# Patient Record
Sex: Male | Born: 1935 | Race: Black or African American | Hispanic: No
Health system: Southern US, Community
[De-identification: ages and names within clinical notes are randomized; demographics above are authoritative.]

---

## 2006-02-22 ENCOUNTER — Ambulatory Visit: Payer: Self-pay | Admitting: Ophthalmology

## 2006-02-22 ENCOUNTER — Other Ambulatory Visit: Payer: Self-pay

## 2006-02-28 ENCOUNTER — Ambulatory Visit: Payer: Self-pay | Admitting: Ophthalmology

## 2008-03-29 ENCOUNTER — Other Ambulatory Visit: Payer: Self-pay

## 2008-03-29 ENCOUNTER — Inpatient Hospital Stay: Payer: Self-pay | Admitting: Internal Medicine

## 2012-09-10 ENCOUNTER — Emergency Department: Payer: Self-pay | Admitting: Emergency Medicine

## 2013-03-02 ENCOUNTER — Ambulatory Visit: Payer: Self-pay | Admitting: Internal Medicine

## 2013-03-20 LAB — BASIC METABOLIC PANEL
Anion Gap: 5 — ABNORMAL LOW (ref 7–16)
BUN: 20 mg/dL — ABNORMAL HIGH (ref 7–18)
Co2: 34 mmol/L — ABNORMAL HIGH (ref 21–32)
Creatinine: 0.97 mg/dL (ref 0.60–1.30)
EGFR (African American): >60
Glucose: 149 mg/dL — ABNORMAL HIGH (ref 65–99)
Osmolality: 287 (ref 275–301)
Potassium: 4.2 mmol/L (ref 3.5–5.1)
Sodium: 141 mmol/L (ref 136–145)

## 2013-03-20 LAB — CBC
HGB: 12.9 g/dL — ABNORMAL LOW (ref 13.0–18.0)
MCH: 27.4 pg (ref 26.0–34.0)
MCHC: 32.6 g/dL (ref 32.0–36.0)
MCV: 84 fL (ref 80–100)
Platelet: 179 10*3/uL (ref 150–440)
RBC: 4.73 10*6/uL (ref 4.40–5.90)
RDW: 19.9 % — ABNORMAL HIGH (ref 11.5–14.5)
WBC: 3.7 10*3/uL — ABNORMAL LOW (ref 3.8–10.6)

## 2013-03-20 LAB — URINALYSIS, COMPLETE
Hyaline Cast: 2
Nitrite: POSITIVE
Ph: 5 (ref 4.5–8.0)
RBC,UR: 142 /HPF (ref 0–5)
Specific Gravity: 1.028 (ref 1.003–1.030)

## 2013-03-21 ENCOUNTER — Inpatient Hospital Stay: Payer: Self-pay | Admitting: Family Medicine

## 2013-03-22 ENCOUNTER — Ambulatory Visit: Payer: Self-pay | Admitting: Radiation Oncology

## 2013-03-22 LAB — COMPREHENSIVE METABOLIC PANEL
Albumin: 2.3 g/dL — ABNORMAL LOW (ref 3.4–5.0)
Anion Gap: 5 — ABNORMAL LOW (ref 7–16)
BUN: 20 mg/dL — ABNORMAL HIGH (ref 7–18)
Bilirubin,Total: 0.6 mg/dL (ref 0.2–1.0)
Chloride: 101 mmol/L (ref 98–107)
Co2: 31 mmol/L (ref 21–32)
Creatinine: 0.93 mg/dL (ref 0.60–1.30)
Glucose: 152 mg/dL — ABNORMAL HIGH (ref 65–99)
Osmolality: 279 (ref 275–301)
Potassium: 4.2 mmol/L (ref 3.5–5.1)
SGPT (ALT): 9 U/L — ABNORMAL LOW (ref 12–78)
Total Protein: 6 g/dL — ABNORMAL LOW (ref 6.4–8.2)

## 2013-03-22 LAB — MAGNESIUM: Magnesium: 1.4 mg/dL — ABNORMAL LOW

## 2013-03-22 LAB — CBC WITH DIFFERENTIAL/PLATELET
Basophil #: 0 10*3/uL (ref 0.0–0.1)
Eosinophil #: 0 10*3/uL (ref 0.0–0.7)
HCT: 34.2 % — ABNORMAL LOW (ref 40.0–52.0)
HGB: 11.3 g/dL — ABNORMAL LOW (ref 13.0–18.0)
Lymphocyte %: 11.1 %
MCH: 27.2 pg (ref 26.0–34.0)
MCHC: 33 g/dL (ref 32.0–36.0)
Monocyte %: 8.9 %
RBC: 4.16 10*6/uL — ABNORMAL LOW (ref 4.40–5.90)
RDW: 19.4 % — ABNORMAL HIGH (ref 11.5–14.5)
WBC: 3.2 10*3/uL — ABNORMAL LOW (ref 3.8–10.6)

## 2013-03-22 LAB — TSH: Thyroid Stimulating Horm: 0.669 u[IU]/mL

## 2013-03-22 LAB — PSA: PSA: 8586 ng/mL — ABNORMAL HIGH (ref 0.0–4.0)

## 2013-03-23 LAB — HEMOGLOBIN: HGB: 12.5 g/dL — ABNORMAL LOW (ref 13.0–18.0)

## 2013-03-26 LAB — CREATININE, SERUM
Creatinine: 0.91 mg/dL (ref 0.60–1.30)
EGFR (African American): >60

## 2013-03-30 LAB — HEPATIC FUNCTION PANEL A (ARMC)
Albumin: 2.7 g/dL — ABNORMAL LOW (ref 3.4–5.0)
Bilirubin,Total: 0.3 mg/dL (ref 0.2–1.0)
SGOT(AST): 30 U/L (ref 15–37)
SGPT (ALT): 25 U/L (ref 12–78)
Total Protein: 6.4 g/dL (ref 6.4–8.2)

## 2013-03-30 LAB — CBC CANCER CENTER
Basophil #: 0.1 x10 3/mm (ref 0.0–0.1)
Basophil %: 0.9 %
Eosinophil #: 0 x10 3/mm (ref 0.0–0.7)
HCT: 35.2 % — ABNORMAL LOW (ref 40.0–52.0)
HGB: 11.5 g/dL — ABNORMAL LOW (ref 13.0–18.0)
Lymphocyte #: 0.3 x10 3/mm — ABNORMAL LOW (ref 1.0–3.6)
MCHC: 32.6 g/dL (ref 32.0–36.0)
MCV: 83 fL (ref 80–100)
Monocyte #: 0.7 x10 3/mm (ref 0.2–1.0)
Neutrophil #: 6.5 x10 3/mm (ref 1.4–6.5)
Neutrophil %: 85.7 %
Platelet: 273 x10 3/mm (ref 150–440)

## 2013-03-30 LAB — CREATININE, SERUM
Creatinine: 1.39 mg/dL — ABNORMAL HIGH (ref 0.60–1.30)
EGFR (African American): 51 — ABNORMAL LOW

## 2013-04-02 ENCOUNTER — Ambulatory Visit: Payer: Self-pay | Admitting: Internal Medicine

## 2013-04-02 ENCOUNTER — Ambulatory Visit: Payer: Self-pay | Admitting: Radiation Oncology

## 2013-05-14 ENCOUNTER — Emergency Department: Payer: Self-pay | Admitting: Emergency Medicine

## 2013-06-05 ENCOUNTER — Inpatient Hospital Stay: Payer: Self-pay | Admitting: Internal Medicine

## 2013-06-05 ENCOUNTER — Ambulatory Visit: Payer: Self-pay | Admitting: Internal Medicine

## 2013-06-05 LAB — URINALYSIS, COMPLETE
Bilirubin,UR: NEGATIVE
Glucose,UR: NEGATIVE mg/dL (ref 0–75)
Hyaline Cast: 3
Ketone: NEGATIVE
Nitrite: NEGATIVE
Protein: 100
RBC,UR: 83 /HPF (ref 0–5)
Specific Gravity: 1.018 (ref 1.003–1.030)
Squamous Epithelial: <1

## 2013-06-05 LAB — COMPREHENSIVE METABOLIC PANEL
Albumin: 2.8 g/dL — ABNORMAL LOW (ref 3.4–5.0)
Alkaline Phosphatase: 391 U/L — ABNORMAL HIGH (ref 50–136)
BUN: 22 mg/dL — ABNORMAL HIGH (ref 7–18)
Calcium, Total: 8.6 mg/dL (ref 8.5–10.1)
Chloride: 107 mmol/L (ref 98–107)
Co2: 31 mmol/L (ref 21–32)
Creatinine: 1.07 mg/dL (ref 0.60–1.30)
EGFR (African American): >60
EGFR (Non-African Amer.): >60
Glucose: 106 mg/dL — ABNORMAL HIGH (ref 65–99)
Potassium: 3.9 mmol/L (ref 3.5–5.1)
SGOT(AST): 23 U/L (ref 15–37)
SGPT (ALT): 12 U/L (ref 12–78)
Sodium: 142 mmol/L (ref 136–145)
Total Protein: 6.6 g/dL (ref 6.4–8.2)

## 2013-06-05 LAB — CBC
HCT: 37.3 % — ABNORMAL LOW (ref 40.0–52.0)
MCHC: 32.9 g/dL (ref 32.0–36.0)
MCV: 89 fL (ref 80–100)
Platelet: 159 10*3/uL (ref 150–440)
RBC: 4.2 10*6/uL — ABNORMAL LOW (ref 4.40–5.90)
RDW: 21.4 % — ABNORMAL HIGH (ref 11.5–14.5)
WBC: 9.5 10*3/uL (ref 3.8–10.6)

## 2013-06-05 LAB — TROPONIN I: Troponin-I: 0.15 ng/mL — ABNORMAL HIGH

## 2013-06-06 LAB — CBC WITH DIFFERENTIAL/PLATELET
Basophil #: 0 10*3/uL (ref 0.0–0.1)
Basophil %: 0.2 %
Eosinophil %: 0.2 %
HCT: 32.4 % — ABNORMAL LOW (ref 40.0–52.0)
HGB: 11 g/dL — ABNORMAL LOW (ref 13.0–18.0)
Lymphocyte %: 5.1 %
MCHC: 33.9 g/dL (ref 32.0–36.0)
MCV: 88 fL (ref 80–100)
Monocyte #: 1.1 x10 3/mm — ABNORMAL HIGH (ref 0.2–1.0)
Neutrophil #: 11.7 10*3/uL — ABNORMAL HIGH (ref 1.4–6.5)
Platelet: 135 10*3/uL — ABNORMAL LOW (ref 150–440)
RBC: 3.69 10*6/uL — ABNORMAL LOW (ref 4.40–5.90)
RDW: 21.4 % — ABNORMAL HIGH (ref 11.5–14.5)
WBC: 13.6 10*3/uL — ABNORMAL HIGH (ref 3.8–10.6)

## 2013-06-06 LAB — BASIC METABOLIC PANEL
Anion Gap: 3 — ABNORMAL LOW (ref 7–16)
BUN: 22 mg/dL — ABNORMAL HIGH (ref 7–18)
Calcium, Total: 8.4 mg/dL — ABNORMAL LOW (ref 8.5–10.1)
Chloride: 108 mmol/L — ABNORMAL HIGH (ref 98–107)
Creatinine: 0.89 mg/dL (ref 0.60–1.30)
EGFR (African American): >60
EGFR (Non-African Amer.): >60
Glucose: 83 mg/dL (ref 65–99)
Osmolality: 286 (ref 275–301)

## 2013-06-06 LAB — TROPONIN I: Troponin-I: 0.16 ng/mL — ABNORMAL HIGH

## 2013-06-07 LAB — CBC WITH DIFFERENTIAL/PLATELET
Basophil %: 0.3 %
Eosinophil %: 0.6 %
Lymphocyte #: 0.6 10*3/uL — ABNORMAL LOW (ref 1.0–3.6)
Lymphocyte %: 8.3 %
MCH: 29.6 pg (ref 26.0–34.0)
MCHC: 33.8 g/dL (ref 32.0–36.0)
MCV: 88 fL (ref 80–100)
Monocyte #: 0.8 x10 3/mm (ref 0.2–1.0)
Monocyte %: 12.2 %
Neutrophil #: 5.4 10*3/uL (ref 1.4–6.5)
Platelet: 138 10*3/uL — ABNORMAL LOW (ref 150–440)
RDW: 20.7 % — ABNORMAL HIGH (ref 11.5–14.5)
WBC: 6.9 10*3/uL (ref 3.8–10.6)

## 2013-06-07 LAB — BASIC METABOLIC PANEL
Chloride: 108 mmol/L — ABNORMAL HIGH (ref 98–107)
Co2: 31 mmol/L (ref 21–32)
Creatinine: 0.9 mg/dL (ref 0.60–1.30)
EGFR (African American): >60
EGFR (Non-African Amer.): >60
Glucose: 95 mg/dL (ref 65–99)
Potassium: 3.7 mmol/L (ref 3.5–5.1)
Sodium: 142 mmol/L (ref 136–145)

## 2013-06-07 LAB — URINE CULTURE

## 2013-06-08 LAB — CBC WITH DIFFERENTIAL/PLATELET
Basophil %: 0.6 %
Eosinophil %: 2.3 %
HCT: 33.3 % — ABNORMAL LOW (ref 40.0–52.0)
Lymphocyte #: 0.6 10*3/uL — ABNORMAL LOW (ref 1.0–3.6)
MCH: 29.4 pg (ref 26.0–34.0)
MCHC: 33.8 g/dL (ref 32.0–36.0)
Monocyte %: 14.5 %
Neutrophil #: 3.3 10*3/uL (ref 1.4–6.5)
Neutrophil %: 68.9 %
RDW: 20.8 % — ABNORMAL HIGH (ref 11.5–14.5)
WBC: 4.7 10*3/uL (ref 3.8–10.6)

## 2013-06-08 LAB — BASIC METABOLIC PANEL
Anion Gap: 3 — ABNORMAL LOW (ref 7–16)
BUN: 12 mg/dL (ref 7–18)
Calcium, Total: 8.4 mg/dL — ABNORMAL LOW (ref 8.5–10.1)
Chloride: 106 mmol/L (ref 98–107)
EGFR (Non-African Amer.): >60
Glucose: 90 mg/dL (ref 65–99)
Potassium: 3.5 mmol/L (ref 3.5–5.1)

## 2013-06-09 LAB — BASIC METABOLIC PANEL
Anion Gap: 5 — ABNORMAL LOW (ref 7–16)
Co2: 30 mmol/L (ref 21–32)
EGFR (African American): >60
EGFR (Non-African Amer.): >60
Osmolality: 280 (ref 275–301)

## 2013-06-09 LAB — CBC WITH DIFFERENTIAL/PLATELET
Basophil %: 0.2 %
Eosinophil %: 1.6 %
HCT: 34.7 % — ABNORMAL LOW (ref 40.0–52.0)
Monocyte #: 0.8 x10 3/mm (ref 0.2–1.0)
Neutrophil %: 75.4 %
RBC: 3.99 10*6/uL — ABNORMAL LOW (ref 4.40–5.90)
WBC: 6 10*3/uL (ref 3.8–10.6)

## 2013-06-10 LAB — CULTURE, BLOOD (SINGLE)

## 2013-06-12 ENCOUNTER — Encounter: Payer: Self-pay | Admitting: Internal Medicine

## 2013-07-02 ENCOUNTER — Encounter: Payer: Self-pay | Admitting: Internal Medicine

## 2013-07-02 ENCOUNTER — Ambulatory Visit: Payer: Self-pay | Admitting: Internal Medicine

## 2013-08-02 ENCOUNTER — Encounter: Payer: Self-pay | Admitting: Internal Medicine

## 2013-12-22 ENCOUNTER — Emergency Department: Payer: Self-pay | Admitting: Emergency Medicine

## 2014-11-22 NOTE — H&P (Signed)
PATIENT NAME:  [name], [name] MR#:  [id number] DATE OF BIRTH:  28-Feb-1921  DATE OF ADMISSION:  06/05/2013  PRIMARY DOCTOR: Dr. Einar CrowMarshall Anderson.   EMERGENCY ROOM PHYSICIAN: Dr. Margarita GrizzleWoodruff.   CHIEF COMPLAINT: Altered mental status.   HISTORY OF PRESENT ILLNESS: A 81 year old male patient with metastatic prostate cancer, brought in to because of altered mental status. The patient lives at home, followed with hospice at home once a week. The patient's daughter noticed that he is getting progressivelyweak  for the past 1 week with decreased p.o. intake for the past 1 week associated with confusion. The patient has shivering this morning and hospice nurse noted that he has temperature so he was brought to the Emergency Room. In the ER, the patient noted to have a temperature of 101.6 Fahrenheit and urine showed a UTI.  CT head did not show any metastasis to the brain. The patient is going to be admitted for altered mental status and UTI.  The patient does have dementia, unable to get any history, and the patient's daughter says that he was eating well and also walking well and did not have any problems until 1 week ago. All of this happened with gradual decline since last 1 week, and the patient not able to even remember 6 family members' names and disoriented to time, place, person.    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 Referred to hospice, the patient getting hospice at home. Also has a history of hypertension right now. Also significant for admission in August from August 25th to 26th.  At that time, he was discharged with Decadron and also he has Cipro for a UTI.  After the discharge, the patient chose Dr. Einar CrowMarshall Anderson as a family doctor so the patient is following up with him.   PAST SURGICAL HISTORY: No surgical history.  ALLERGIES:  No known allergies.  HOME MEDICATION:  1.  Ativan 1  mg 4 times a day.  2.  Bicalutamide 50 mg p.o. once a day.  3.  Percocet 5/325 one tablet p.o. t.i.d.  4.  Senna 8.6 mg p.o. daily.  5.  Seroquel 25 mg daily.   SOCIAL HISTORY: Lives at home. No smoking, no drinking.   FAMILY HISTORY: Both grandparents are healthy.   REVIEW OF SYSTEMS: Not obtainable due to dementia and also some confusion due to UTI.  The patient's family says that the patient had a fall 2 weeks ago and broke collarbone, supposed to see orthopedic doctor.   PHYSICAL EXAMINATION: VITAL SIGNS: Temperature 101.6, heart rate 114, blood pressure is 113/70, sats 92% on room air.  GENERAL: The patient is demented, not able to give any history and not appearing in distress and not oriented to time, place, person.  HEENT:  PERRLA, EOM are intact. No conjunctivitis. No fluid.  Gross is eating is intact. Tympanic membranes clear. Mucous membranes are dry.  NECK: Supple. No JVD. No carotid bruit. No lymphadenopathy.  RESPIRATORY: Clear to auscultation. No wheeze. No rales. Decreased breath sounds bilaterally.  CARDIOVASCULAR: S1, S2 regular, tachycardic, no murmurs.  ABDOMEN: Soft, nontender, nondistended. Bowel sounds present.  MUSCULOSKELETAL: The patient unable to follow direction, not able to participate in examination of muscle strength.   SKIN: Has no skin rashes. Skin warm and dry, no erythema, no lesions.  NEUROLOGIC: The patient has no obvious neurological deficits and no contractures. DTRs 2+ bilaterally. Cranial nerves appear to be intact.  PSYCHIATRIC: The patient is not oriented to time, place, person.   PERTINENT LABORATORY DATA: Troponin is up at 0.15. UA is amber-colored, cloudy urine, 2+ leukocyte esterase, bacteria 3+, WBC 246. Electrolytes: Sodium is 142, potassium 3.9, chloride 107, bicarb 31, BUN 22, creatinine 1, glucose 106. WBC 9.5, hemoglobin 12.3, hematocrit 37.3, platelets 159. Chest x-ray shows bibasilar atelectasis, no edema, nodular density in the right  lower lobe about 8 mm, the patient may have metastasis; mild compression deformity upper lumbar spine. CT head is unremarkable for any metastasis.   ASSESSMENT AND PLAN:  68.  A 81 year old male patient with systemic inflammatory response syndrome secondary to urinary tract infection. The patient had urinary tract infection present on admission. Admit to hospitalist service. Continue IV fluids, normal saline at 80 mL an hour. Follow the urine culture. Start broad-spectrum antibiotics with Rocephin. Follow fever curve. Follow the blood cultures as well.   2.  Evidence of systemic inflammatory response syndrome with tachycardia, and temperature more than 100.6.  3.  Altered mental status, likely secondary to urinary tract infection and metabolic encephalopathy. CT head is unremarkable. The patient also has baseline dementia. Continue fluids and urinary tract infection treatment and see how patient reacts with this. 4.  History of metastatic prostate cancer, followed by hospice at home. The patient right now is altered mental status with confusion so will hold p.o. medications. Continue n.p.o. and get palliative care consult to see the patient. If the patient does not improve, may be appropriate for him to go to hospice home.  5.  CODE STATUS: DO NOT RESUSCITATE.   TIME SPENT: Is about 55 minutes.   ____________________________ Katha Hamming, MD sk:cs D: 06/05/2013 13:53:00 ET T: 06/05/2013 14:44:04 ET JOB#: [id number]  cc: Katha Hamming, MD, <Dictator> Marya Amsler. Dareen Piano, MD Katha Hamming MD ELECTRONICALLY SIGNED 07/02/2013 15:21

## 2014-11-22 NOTE — Discharge Summary (Signed)
PATIENT NAME:  [name], Jesus MR#:  [id number] DATE OF BIRTH:  05-Oct-1920  DATE OF ADMISSION:  03/22/2013 DATE OF DISCHARGE:  03/27/2013  CONSULTATION:  Palliative care consult with Dr. Harvie JuniorPhifer.  Oncology consult with Dr. Lorre NickGittin.  HOSPITAL COURSE:  491.  A 81 year old male patient with history of prostate cancer who came in because of severe back pain. Look at history and physical for full details. The patient did not receive any treatment for prostate cancer. He came in because of severe back pain that started 6 months ago getting progressively worse and came because he was unable to ambulate even with a cane. Abdominal CAT scan study: CT abdomen revealed advanced prostate cancer with extracapsular extension and invasion to bladder and also widespread metastasis to lumbar and cervical spine with canal narrowing. The patient received 10 mg of Decadron in the Emergency Room. The patient was admitted to the hospitalist service, started on Decadron. He was started on 4 mg IV every 6 hours. Oncology consult with Dr. Lorre NickGittin obtained.  Also we continued morphine for pain. The patient had bedrest. He was seen by Dr. Lorre NickGittin and also Dr. Harvie JuniorPhifer.  Right now his pain is controlled. He is on Norco 5/325 q.4 p.r.n. for pain and Decadron 4 mg p.o. b.i.d. The patient is going to have radiation therapy today and after that he can be discharged home with hospice. Arrangements are made for  hospital bed,. The patient can continue the Decadron 4 mg p.o. b.i.d. and Norco. Prescriptions are already given.   2.  Acute cystitis;  The patient is on Cipro.  That can be continued. Prescriptions are given.  MEDICATIONS:  The patient is on Cipro 500 mg every 12 hours.  Complete the Cipro on 08/28.  The patient was started on bicalutamide 50 mg p.o. daily. The patient can continue Casodex for prostate cancer.   The patient will be discharged to home after the radiation therapy today and then follow up with oncology, Dr. Lorre NickGittin, in a  week.    Time spent on discharge preparation: More than 30 minutes.    ____________________________ Katha HammingSnehalatha Harlan Vinal, MD sk:dp D: 03/27/2013 11:04:22 ET T: 03/27/2013 12:12:09 ET JOB#: [id number]  cc: Katha HammingSnehalatha Darrian Goodwill, MD, <Dictator> Katha HammingSNEHALATHA Moselle Rister MD ELECTRONICALLY SIGNED 04/13/2013 22:48

## 2014-11-22 NOTE — Consult Note (Signed)
   Comments   I met with pt's son and daughter-in-law. They are awaiting oncology recommendation re XRT. However, they do not want any aggressive tx or procedures and actually brought up the idea of the Hospice Home. I will follow up in AM after Dr Inez Pilgrim has seen pt.  increase the frequency of morphine for pain. Also have ketorolac available for bone pain.   Electronic Signatures: Heavin Sebree, Izora Gala (MD)  (Signed 20-Aug-14 16:00)  Authored: Palliative Care   Last Updated: 20-Aug-14 16:00 by Reedy Biernat, Izora Gala (MD)

## 2014-11-22 NOTE — Consult Note (Signed)
Brief Consult Note: Diagnosis: cancer prostate, uti, hhydronephrosis right, bone and epidural and retroperitoneal node metastasis,.   Patient was seen by consultant.   Comments: DICTATED NOTE TO FOLLOW   APPEARS TO BE ADVANCED LOCOREGIONAL AND METASTATIC PROSTATE CANCER, PSA PENDING, NO BX WILL BE NEEDED, HGB OK, BASELINE CRE AND LFT OK, S/P DYE STUDY, ON HYDRATION. HAS FOLEY FOR URINE INCONTINENCE, AT RISK BUT NO SIGNS OF BOWEL OBSTRUCTION, HAS GOOD MOVEMENT LOWER EXT BUT NON AMBULATORY DUE TO WEAKNESS. ON DECADRON, SLIGHT CONFUSION....Marland Kitchen.PLAN, DECADRON, BUT REDUCE DOSE IF SIDE EFFECTS, WATCH GLU AND CRE, CHECK PSA, WOULD START CASODEX 50 MG PO DAILY, WILL HAVE RADIATION ONCOLOGY SEE IN AM, PLAN DELAYED START OF LUPRON, CAN  NOT GIVE INITIALLY AS WOULD CAUSE INITIAL DISEASE FLAIR AND POSSIBLE CORD COMPRESSION. DISCUSSED WITH PATIENT AND FAMILY AT BEDSIDE.  THEY WISH TO PROCEED WITH TX.  COULD ALSO ADD XGEVA.  Electronic Signatures: Marin RobertsGittin, Charlette Hennings G (MD)  (Signed 20-Aug-14 18:54)  Authored: Brief Consult Note   Last Updated: 20-Aug-14 18:54 by Marin RobertsGittin, Jolyn Deshmukh G (MD)

## 2014-11-22 NOTE — Consult Note (Signed)
PATIENT NAME:  [name], [name] MR#:  [id number] DATE OF BIRTH:  Apr 06, 1921  AGE:  81 years  SEX:  Male  RACE:  African-American  DATE OF ADMISSION:  03/21/2013  DATE OF DICTATION:  03/24/2013  PLACE OF DICTATION:  [facility], room [address]251, [place]  DATE OF CONSULTATION:  03/24/2013  CONSULTING PHYSICIAN:  Raphel Stickles K. Cambelle Suchecki, MD  SUBJECTIVE:  The patient is a 81 year old African-American male who is retired from working in a mill  and has been living by himself in a house. Son, who is 81 years old, has been visiting him on an every-day basis and taking care of the patient. The patient has history of prostate cancer, and admitted [facility] with the chief complaint of chronic pain in the back. Son was a good historian, and he was present during the interview.   PAST PSYCHIATRIC HISTORY:  No previous history of inpatient hospital psychiatry. No history of suicide attempt. Son reports that recently the patient had been confused, and this is getting worse and he is not able to care for himself, so he will be living with the son after he is discharged [facility] at this time.   ALCOHOL AND DRUGS:  Denied.  MENTAL STATUS EXAMINATION:  The patient was seen lying in bed. He is alert. He knew he was at [facility]. He knew the date, though he took some time, and he was slow to process this. He denies feeling depressed. Denies feeling hopeless or helpless. He could recognize his son. He could count money, and said 4 quarters, 10 dimes, 20 nickels, 100 pennies in a dollar. Denies any auditory or visual hallucinations. Both the patient and son report no psychotic thinking. He could recognize his son, but he could not recollect his date of birth, though he was given adequate time to do so. Memory and recall are poor. Denies any ideas or plans to hurt himself or others. Insight and judgment are guarded versus poor.   IMPRESSION:  Vascular dementia, mild.  RECOMMENDATIONS:  The patient is not able to live by  himself and take care of himself at this time because of episodes of confusion and poor memory and recall. He does need supervision and help at this time. Son is the power of attorney, and he will be taking care of him.   ____________________________ Jannet MantisSurya K. Guss Bundehalla, MD skc:mr D: 03/24/2013 16:07:27 ET T: 03/24/2013 19:22:57 ET JOB#: [id number]  cc: Monika SalkSurya K. Guss Bundehalla, MD, <Dictator> Beau FannySURYA K Gianne Shugars MD ELECTRONICALLY SIGNED 03/25/2013 15:28

## 2014-11-22 NOTE — Consult Note (Signed)
PATIENT NAME:  [name] MR#:  [id number] DATE OF BIRTH:  1921-01-26  DATE OF CONSULTATION:  03/21/2013  REFERRING PHYSICIAN:   CONSULTING PHYSICIAN:  Simonne Come. Inez Pilgrim, MD  HISTORY OF PRESENT ILLNESS: Mr. [name] is a 81 year old patient who was admitted on 08/19. I saw and evaluated, met with the family and placed a note on the chart on an 08/20 evaluation, although this narrative is delayed until the current time. The patient presented to the ER with acute on chronic low back pain and increased weakness for a long time so that he could barely ambulate without help. Family insisted he come to the hospital and he also had some slight intermittent confusion so head CT was done of the brain which revealed no acute problems, CT of the abdomen and pelvis showed advanced prostate cancer with extracapsular extension and invasion to the bladder, some compression of the rectum, widespread bony metastatic disease and extraosseous extension narrowing the spinal canal. He was initially given Decadron intravenously. He was made a NO CODE patient. He has a history of prostate cancer and never sought treatment. He had had some hypertension and was not under treatment.   MEDICATIONS: No regular medications.   FAMILY HISTORY: Negative.   SOCIAL HISTORY: No alcohol. No tobacco.   REVIEW OF SYSTEMS: The patient had significant back and skeletal pain initially when I saw him. This persisted but was better with narcotics and Decadron. At rest he was really comfortable. It was on movement when he had more pain. He denied any visual disturbance, any headache, any dizziness, any chills or sweats. He was not short of breath at rest. No palpitations or retrosternal chest pain. He apparently had some urine incontinence and a Foley had been placed. He had no thyroid or diabetes history and no easy bleeding or bruising. No history of anemia. No history of skin problems. No psychiatric history or history of depression.    PHYSICAL EXAMINATION: The patient was thin, chronically ill-appearing. Foley was in place. He was alert and cooperative. Spoke tangentially momentary then his concentration came back and was focused. He had no oral thrush. He had no mass in the neck or lymph nodes palpable in the neck, submandibular or axilla. His abdomen was soft and nontender, and he had no gross focal weakness. He was hard of hearing. There was no extremity edema. No rash.    LABORATORY AND DIAGNOSTICS: CT of the head has already been described. Was unremarkable. His creatinine on admission was 0.97. His white count was 3.7, hemoglobin was 12.9 and platelets were 179. The calcium was 9.0. He had a positive esterase and was given Cipro for cystitis.   IMPRESSION: The patient is with widespread disease consistent with prostate cancer and the PSA that was later available was over 8000 as anticipated. CEA was low at 1.6. CT of abdomen and pelvis was done because of back pain and some hematuria, initially looking for stone. Showed the findings as described above with retroperitoneal adenopathy also and some mild right hydronephrosis and then diffuse bony disease and involvement of the bladder. MRI of the lumbar spine shows also epidural metastatic disease. Other labs are steady.   PLAN: Decadron was started. Watch for side effects or neurologic problems, confusion or hyperglycemia. Plan is to follow the PSA. Started Casodex 50 mg daily. Plan is to give for 2 weeks then start Lupron. I have asked radiation/oncology to see about radiating the areas of greatest pain and epidural disease. There is no role for  neurosurgery for this patient. Later could add  XGEVA. Plan to follow up in the hospital daily.  ____________________________ Simonne Come Inez Pilgrim, MD rgg:sb D: 04/02/2013 16:16:00 ET T: 04/02/2013 16:47:17 ET JOB#: [id number]  cc: Simonne Come. Inez Pilgrim, MD, <Dictator> Dallas Schimke MD ELECTRONICALLY SIGNED 05/02/2013 11:23

## 2014-11-22 NOTE — Discharge Summary (Signed)
PATIENT NAME:  PAXSON, [name]#:  [id number] DATE OF BIRTH:  April 21, 1921  DATE OF ADMISSION:  03/21/2013 DATE OF DISCHARGE:  03/26/2013  PRIMARY CARE PHYSICIAN: The patient does not have one. The patient has been referred to see Dr. Lorre Nick, heme/onc, for his care now and then.   REASON FOR ADMISSION: Severe back pain.   DISPOSITION: Home. The patient has been offered hospice care, and the family is open to that, but first they are going to undergo some treatment, mostly palliative, for treatment of prostate cancer, which is metastatic.   DISCHARGE DIAGNOSES: 1.  Severe low back pain.  2.  Spinal canal narrowing due to metastatic prostate cancer.  3.  Acute cystitis.  4.  Hydronephrosis.  5.  Prostate cancer.  6.  Hypertension.  7.  Hyperglycemia.  8.  Hypomagnesemia.  9.  Hypobilirubinemia.    10.  Chronic disease anemia.   MEDICATIONS AT DISCHARGE: 1.  Dexamethasone 4 mg twice daily.  2.  Norco 5/325 every 4 hours as needed for pain.  3.  Casodex 1 tablet orally once daily 50 mg. 4.  Protonix 40 once daily.  5.  Ciprofloxacin 500 mg every 12 hours until prescription is gone.  6.  Ativan 1 mg 4 times a day as needed for anxiety.  7.  Hospital bed. 8.  Standard walker. 9.  Transport wheelchair. 10.  Shower chair.  11.  Bedside commode.   DISCHARGE INSTRUCTIONS   1.  Follow up with Dr. Lorre Nick in the next 1 to 2 weeks.  2.  Follow up with Dr. Aggie Cosier, [facility] radiation oncology the day after discharge for first treatment of radiation.   IMPORTANT RESULTS: MRI of the lumbar spine has widespread bony and epidural metastatic disease associated with diffuse spinal stenosis. No evidence of lumbar cord compression. There are ventral epidural soft tissue enhancing lesions noted in L1-L2, L5-S1. S1 is consistent with ventral epidural metastatic lesions as well contributing to diffuse spinal stenosis.   HOSPITAL COURSE:  Thompson. [name] is a very nice, 81 year old gentleman who has  history of hypertension and prostate cancer, who never got treatment for it.  He presented to the ER on the 20th with a complaint of lower back pain that has been going on for over 6 months, progressively getting worse. The patient had reached the point that he was unable to ambulate. Prior to that, he was able to get around with a cane pretty good.  He has been having significant urinary incontinence, and since the patient lives alone, he has not been able to be caring for himself very well.  He has family who is very supportive, but there has not been a decision about moving in with family at any point up until now.    He had a CT scan that revealed some changes, advanced prostate cancer with extracapsular lesions, invasion of the bladder, base of the spine with severe canal stenosis due to metastatic lesions.   The patient received Decadron in the ER, and Dr. Lorre Nick of oncology was consulted.  He recommended  to start oncasodex then initiate radiation therapy as apalliative measure. The patient was admitted and was admitted and worked up for that. He was taken to the radiation oncology department for in order to initiate his treatment.  He is going to initiate treatment on 03/27/2013. During this admission, the patient was very confused the first day, likely due to mild metabolic encephalopathy, improved rapidly. He had a urinary tract infection that  was treated with ciprofloxacin. His culture really suggests contamination, but since the patient was so symptomatic, we have decided to continue the treatment.   Palliative care was consulted to help with transitioning the patient into hospice, as the family does not want to be aggressive on the treatment, but at the end they decided to continue with at least some radiation therapy for palliative measure, and until he is off the radiation therapy and off the casodex hospice cannot take over.  As soon as hospice takes over, the patient is going to be under their  care.  The patient is discharged in good condition. I had a phone call later on that the patient did not have all the treatments at home, for which he might need to stay overnight. TIME SPENT:  I spent about 45 minutes with this discharge.   ____________________________ Felipa Furnaceoberto Sanchez Gutierrez, MD rsg:dmm D: 03/27/2013 10:19:00 ET T: 03/27/2013 11:48:10 ET JOB#: [id number]  cc: Felipa Furnaceoberto Sanchez Gutierrez, MD, <Dictator> Paisli Silfies Juanda ChanceSANCHEZ GUTIERRE MD ELECTRONICALLY SIGNED 03/30/2013 12:59

## 2014-11-22 NOTE — Consult Note (Signed)
Reason for Visit: This 81 year old Male patient presents to the clinic for initial evaluation of  stage IV prostate cancer .   Referred by Dr. Neale Burly.  Diagnosis:  Chief Complaint/Diagnosis   patient is a 81 year old male recently admitted to [facility] for intractable lower back pain. He has a history of prostate cancer. His lower back pain started around 6 months ago his presently worse. Limited CT scan was performed [facility] showing widespread bony metastatic disease withepidural metastatic disease and diffuse spinal stenosis. No evidence of lumbar cord compression was neaten seen.pSA is pending. CT scan of the pelvis shows markedly locally advanced prostate cancer with extracapsular extension and invasion into the bladder. Significant extraosseous extension into the lumbar and sacral spine resulting in severe canal Shirlee Limerick was also noted. He's been started on Decadron as well as narcotic analgesics. I been asked to evaluate the patient by medical oncology for consideration of palliative treatment to his lumbar spine and SI joint region.  Imaging Report CT scans MR scans reviewed   Referral Report clinical notes reviewed   Planned Treatment Regimen palliative radiation therapy to lower spine and possible Xifigo   HPI   as above  Past Hx:    prostate cancer:    constipation:    Kidney Stones:   Past, Family and Social History:  Past Medical History positive   Cardiovascular hypertension   Genitourinary nontreated prostate cancer   Family History noncontributory   Social History noncontributory   Additional Past Medical and Surgical History seen in his hospital bed   Allergies:   No Known Allergies:   Review of Systems:  General negative   Performance Status (ECOG) 1   Skin negative   Breast negative   Ophthalmologic negative   ENMT negative   Respiratory and Thorax negative   Cardiovascular negative   Gastrointestinal negative    Genitourinary see HPI   Musculoskeletal negative   Neurological negative   Psychiatric negative   Hematology/Lymphatics negative   Endocrine negative   Allergic/Immunologic negative   Physical Exam:  General/Skin/HEENT:  Skin normal   Eyes normal   ENMT normal   Head and Neck normal   Additional PE well-developed male in NAD. Lungs are clear to A&P cardiac examination shows regular rate and rhythm. Pain is elicited on deep palpation of his lumbar spine. Motor sensory and DTR levels are equal and symmetric in the upper lower extremities. Proprioception is intact. Range of motion lower extremities doesn't elicit some pain.   Breasts/Resp/CV/GI/GU:  Respiratory and Thorax normal   Cardiovascular normal   Gastrointestinal normal   Genitourinary normal   MS/Neuro/Psych/Lymph:  Musculoskeletal normal   Neurological normal   Lymphatics normal   Other Results:  Radiology Results: LabUnknown:    20-Aug-14 02:59, CT Abdomen and Pelvis With Contrast  PACS Image     20-Aug-14 13:04, MRI Lumbar Spine [facility]  PACS Image   MRI:  MRI Lumbar Spine WWO   REASON FOR EXAM:    acute back pain with mets  COMMENTS:       PROCEDURE: MR  - MR LUMBAR SPINE WO/W  - Mar 21 2013  1:04PM     RESULT: History: Pain.    Comparison study: CT abdomen pelvis 03/21/2013.    Findings: Multiplanar, multisequence imaging obtained. 12 cc of   multi-Hance administered. Innumerable bony lesions are noted consistent   with metastatic disease. No evidence of fracture. Multilevel disc   degeneration and annular bulge facet hypertrophy and hypertrophy ligament  flavum is present with spinal stenosis. Ventral epidural soft tissue   tissue enhancing lesions noted posterior to L1, L2 f, L5 and S1     consistent with ventral epidural metastatic lesions. This contributes to   the diffuse spinal stenosis present. Multiple sacral and iliac lesions   are present.    IMPRESSION:  Widespread bony and  epidural metastatic disease. Associated   diffuse spinal stenosis. No evidence of lumbar cord compression.        Verified By: Gwynn BurlyHOMAS E. REGISTER, M.D., MD  CT:    20-Aug-14 02:59, CT Abdomen and Pelvis With Contrast  CT Abdomen and Pelvis With Contrast   REASON FOR EXAM:    (1) liver mass and weight loss eval; (2) liver mass   and weight loss eval  COMMENTS:       PROCEDURE: CT  - CT ABDOMEN / PELVIS  W  - Mar 21 2013  2:59AM     RESULT: CT of the abdomen and pelvis is performed with 85 mL of   Isovue-370 iodinated intravenous contrast without oral contrast. The   patient has just undergone noncontrast study. There is history prostate   cancer.    The study demonstrates extensive bony metastatic disease as noted   previously. The abnormality in the rectum appears to be extracapsular   extension of the prostate carcinoma encircling the distal rectum. There   is deformity of the base the bladder. There may be bladder invasion.     Pelvic adenopathy and retroperitoneal adenopathy is present. There is   mild right hydronephrosis which could be related to this. No definite   nephrolithiasis is appreciated. No ureterolithiasis is evident but the   ureter is difficult to follow. The lung bases appear clear with minimal   atelectasis and underlying chronic lung disease. The aorta is normal in   caliber. Cholelithiasis is evident. Cysts are seen in the caudate lobe   and left hepatic lobe without abnormal enhancement. Small right hepatic   lobe cyst is present. The spleen is unremarkable. The pancreas is within   normal limits. There is severe spinal canal stenosis in the lumbar spine   especially at L4-L5. There is retrolisthesis of L4 on L5. The osseous   deformity and possibly some extra osseous extension of malignancy is   causing significant spinal canal narrowing at this level and at L3-L4 and   possibly at L2-L3. There is no bowel obstruction evident. Small amount of   urine is  seen in the bladder.  IMPRESSION:  Extracapsular extension of prostate carcinoma with   encasement of the distal rectum. Probable extension into the bladder base   and seminal vesicles. Extensive pelvic and retroperitoneal para-aortic   and paracaval adenopathy with mild right hydronephrosis. Widespread bony   metastatic disease with some extraosseousextension as described.   Multilevel areas of severe spinal canal stenosis in the lumbar spine.    Dictation Site: 1        Verified By: Elveria RoyalsGEOFFREY H. BROWNE, M.D., MD   Relevent Results:   Relevant Scans and Labs CT scans and MRI scans reviewed   Assessment and Plan: Impression:   stage IV widespread bony metastatic disease in 81 year old male withintractable lower back pain. Plan:   the stomach to start a short course of radiation therapy to his lumbar spine and SI joints. We'll plan on delivering 3000 cGy in 10 fractions. I have set him up for CT simulation tomorrow morning to start early next week. Patient may  also be a candidate for Xifigo treatment using radium to 23 for attempt to control his other ostium metastasis. Risks and benefits of radiation therapy to his lumbar spine including possibility of diarrhea, possibility of urinary frequency, skin reaction, alteration blood counts, and fatigue all were explained in detail to the patient.  I would like to take this opportunity to thank you for allowing me to continue to participate in this patient's care.  Electronic Signatures: Rebeca Alert (MD)  (Signed 21-Aug-14 12:33)  Authored: HPI, Diagnosis, Past Hx, PFSH, Allergies, ROS, Physical Exam, Other Results, Relevent Results, Encounter Assessment and Plan   Last Updated: 21-Aug-14 12:33 by Rebeca Alert (MD)

## 2014-11-22 NOTE — H&P (Signed)
PATIENT NAME:  [name], [name] MR#:  [id number] DATE OF BIRTH:  10/04/1920  DATE OF ADMISSION:  06/05/2013  PRIMARY DOCTOR: Dr. Einar CrowMarshall Thompson.   EMERGENCY ROOM PHYSICIAN: Dr. Margarita GrizzleWoodruff.   CHIEF COMPLAINT: Altered mental status.   HISTORY OF PRESENT ILLNESS: A 81 year old male patient with metastatic prostate cancer, brought in because of altered mental status. The patient lives at home, followed with hospice at home once a week. The patient's daughter noticed that he is getting progressively less alert for the past 1 week with decreased p.o. intake for the past 1 week associated with confusion. The patient has shivering this morning and hospice nurse noted that he had a temperature so he was brought to the Emergency Room. In the ER, the patient noted to have a temperature of 101.6 Fahrenheit and urine showed a UTI.  CT head did not show any metastasis to the brain. The patient is going to be admitted for altered mental status and UTI.  The patient does have dementia, unable to get any history, and the patient's daughter says that he was eating well and also walking well, and did not have any problems until 1 week ago. All of this happened with gradual decline since last 1 week, and the patient not able to even remember the family members' names, and is not oriented to time, place, person.   PAST MEDICAL HISTORY: Significant for prostate cancer, metastatic prostate cancer to lungs and the patient is not getting any radiation therapy, referred to hospice. The patient is getting hospice at home. Also has a history of hypertension right now. Also significant for admission in August from 25th to 26th.  At that time he was discharged with Decadron and also he had Cipro for a UTI.  After the discharge, the  patient chose Dr. Einar CrowMarshall Thompson as a family doctor so the patient is following up with him.   PAST SURGICAL HISTORY: No surgical history.  ALLERGIES:  No known allergies.    HOME MEDICATION:  1.   Ativan 1 mg 4 times a day.  2.  Bicalutamide 50 mg p.o. once a day.  3.  Percocet 5/325 one tablet p.o. t.i.d. 4.  Senna 8.6 mg p.o. daily.  5.  Seroquel 25 mg daily.   SOCIAL HISTORY: Lives at home. No smoking, no drinking.   FAMILY HISTORY: Both grandparents are healthy.   REVIEW OF SYSTEMS: Not obtainable due to dementia and also some confusion due to UTI.  The patient's family says that the patient had a fall 2 weeks ago and broke collarbone, supposed to see orthopedic doctor.   PHYSICAL EXAMINATION: VITAL SIGNS: Temperature 101.6, heart rate 114, blood pressure is 113/70, sats 92% on room air.  GENERAL: The patient is demented, not able to give any history, not appearing in distress and not oriented to time, place, person.  HEENT:  PERRLA, EOM intact. No conjunctivitis. No fluid.  Gross hearing is intact. Tympanic membranes clear. Mucous membranes are dry.  NECK: Supple. No JVD. No carotid bruit. No lymphadenopathy.  RESPIRATORY: Clear to auscultation. No wheeze. No rales. Decreased breath sounds bilaterally.  CARDIOVASCULAR: S1, S2 regular, tachycardic, no murmurs.  ABDOMEN: Soft, nontender, nondistended. Bowel sounds present.  MUSCULOSKELETAL: The patient unable to follow direction, not able to participate in examination of muscle strength.  SKIN: Has no skin rashes. Skin warm and dry. No erythema, no lesions.  NEUROLOGIC: The patient has no obvious neurological deficits and no contractures. DTRs 2+ bilaterally. Cranial nerves appear to be intact.  PSYCHIATRIC: The patient is not oriented to time, place, person.   PERTINENT LABORATORY AND RADIOLOGICAL DATA: Troponin is up at 0.15. UA is amber-colored, cloudy urine, 2+ leukocyte esterase, bacteria 3+, WBC 246. Electrolytes: Sodium is 142, potassium 3.9, chloride 107, bicarb 31, BUN 22, creatinine 1, glucose 106. WBC 9.5, hemoglobin 12.3, hematocrit 37.3, platelets 159.   Chest x-ray shows bibasilar atelectasis, no edema, nodular  density in the right lower lobe about 8 mm, the patient may have metastasis. Mild compression deformity at the upper lumbar spine. CT head is unremarkable for any metastasis.   ASSESSMENT AND PLAN:  44.  A 81 year old male patient with systemic inflammatory response syndrome secondary to urinary tract infection. The patient had urinary tract infection present on admission. Admit to hospitalist service. Continue IV fluids, normal saline at 80 mL an hour. Follow the urine culture.  Start broad-spectrum antibiotics with Rocephin. Follow fever curve. Follow the blood cultures as well.   2.  Evidence of systemic inflammatory response syndrome with tachycardia and temperature more than 100.6.  3. Altered mental status, likely secondary to urinary tract infection and metabolic encephalopathy. CT head is unremarkable.  The patient also has baseline dementia. Continue fluids and urinary tract infection treatment, and see what how the patient reacts with this. 4.  History of metastatic prostate cancer followed by hospice at home.  5. The patient right now has altered mental status with confusion so hold p.o. medications. Continue n.p.o. and get palliative care consult to see the patient.  If the patient does not improve, it may be appropriate for him to go to hospice home.  6.  CODE STATUS: DO NOT RESUSCITATE.   TIME SPENT: Is about 55 minutes.  Turn over to Dr. Dareen Piano.   ____________________________ Katha Hamming, MD sk:cs D: 06/05/2013 13:53:00 ET T: 06/05/2013 14:33:36 ET JOB#: [id number]  cc: Katha Hamming, MD, <Dictator>  Katha Hamming MD ELECTRONICALLY SIGNED 07/02/2013 15:21

## 2014-11-22 NOTE — H&P (Signed)
PATIENT NAME:  [name], [name] MR#:  [id number] DATE OF BIRTH:  [birth year]  DATE OF ADMISSION:  03/20/2013  PRIMARY CARE PHYSICIAN: Nonlocal.   REFERRING PHYSICIAN: Rebecka Apley, MD  CHIEF COMPLAINT: Severe low back pain.   HISTORY OF PRESENT ILLNESS: The patient is a 81 year old  male with past medical history of hypertension and prostate cancer, never got any treatment, who is presenting to the ER with a chief complaint of acute on chronic low back pain. The patient has been having lower back pain for the past 6 months which has been progressively getting worse, and in the past few days, he has reached the point where he could not ambulate even with the help of cane. The patient has remote history of prostate cancer, but never got any treatment for that. The severe low back pain is associated with urinary incontinence, difficulty in walking and generalized weakness. The patient lives alone. CAT scan of the head has revealed no acute findings, but CAT scan of the abdomen and pelvis has revealed advanced prostate cancer with extracapsular extension and invasion into the bladder. Also, there is widespread bony metastatic disease with extraosseous extension into the lumbar and sacral spine resulting in severe canal narrowing. The patient has received Decadron 10 mg IV in the ER, and the ER physician, Dr. Zenda Alpers, has discussed with on-call oncologist, Dr. Lorre Nick, regarding STAT radiation therapy, and he is aware, and he has recommended to admit the patient here, and Dr. Lorre Nick is going to see the patient as soon as possible. The patient has received morphine, which has alleviated his pain. Son and daughter-in-law are at bedside. They made his code status do not resuscitate and also considering comfort care measures only. The patient denies any chest pain or shortness of breath. No other complaints.   PAST MEDICAL HISTORY:  1. Prostate cancer, never got treated.  2. Hypertension.   PAST SURGICAL  HISTORY: None.   ALLERGIES: No known drug allergies.   HOME MEDICATIONS: None.   PSYCHOSOCIAL HISTORY: Lives at home, lives alone. No history of smoking, alcohol or illicit drug usage.   FAMILY HISTORY: According to the son, both his grandparents are healthy.   REVIEW OF SYSTEMS: CONSTITUTIONAL: Denies any fever or fatigue. Complaining of weakness and severe low back pain.  EYES: No blurry vision. Denies any redness.  EARS, NOSE, THROAT: No epistaxis or discharge.  RESPIRATION: No cough, COPD.  CARDIOVASCULAR: No chest pain, palpitations.  GASTROINTESTINAL: Denies nausea, vomiting, diarrhea.  GENITOURINARY: No hematuria. Has urinary incontinence. Has a remote history of prostate cancer, never got treated.  ENDOCRINE: No polyuria, nocturia or thyroid problems.  HEMATOLOGIC AND LYMPHATIC: No anemia, bleeding.  INTEGUMENTARY: No acne, rash, lesions.  MUSCULOSKELETAL: Severe low back pain. Denies gout.  NEUROLOGIC: No history of ataxia or dementia.  PSYCHIATRIC: No ADD, OCD.   PHYSICAL EXAMINATION:  VITAL SIGNS: Temperature is not recorded, blood pressure 131/74, pulse 95%, pulse 88, respirations 18.  GENERAL APPEARANCE: Not in acute distress. Moderately built and nourished.  HEENT: Normocephalic, atraumatic. Pupils are equally reactive to light and accommodation. No conjunctival injection. No sinus tenderness. No postnasal drip.  NECK: Supple. No JVD. No thyromegaly.  LUNGS: Clear to auscultation bilaterally. No accessory muscle usage. No anterior chest wall tenderness on palpation.  CARDIAC: S1, S2 normal. Regular rate and rhythm. No murmurs.  GASTROINTESTINAL: Soft. Bowel sounds are positive in all 4 quadrants. Nontender, nondistended. No hepatosplenomegaly. No rebound tenderness.  NEUROLOGIC: Awake, alert and oriented x3. The patient is hard  of hearing, but answering questions appropriately and following verbal commands. Motor and sensory are grossly intact. Reflexes are 2+. No  peroneal signs.  EXTREMITIES: No edema. No cyanosis or clubbing.   LABORATORY AND IMAGING STUDIES: CAT scan of the head: No acute findings. CAT scan of the abdomen and pelvis: Advanced prostate cancer with extracapsular extension and circumferential rectal encasement as well as invasion into bladder, kidneys and seminal vesicles. Retroperitoneal lymphadenopathy consistent with nodal metastasis encases the ureters and results in mild right and minimal left hydronephrosis. Widespread osseous metastatic disease with extraosseous extension to lumbar and cervical spine resulting in severe canal narrowing. Glucose 149, BUN 20, creatinine 0.97, sodium 141, potassium 4.2, chloride 102, CO2 34, anion gap 4, GFR greater than 50, osmolality 287, calcium 9.0. WBC 3.7, hemoglobin 12.9, hematocrit 39.6, platelets 179. Urinalysis: Amber color, cloudy in appearance, glucose negative, bilirubin 1+, ketones trace, nitrite positive, leukocyte esterase 2+.   ASSESSMENT AND PLAN: A 81 year old  male presenting to the Emergency Room with a chief complaint of acute on chronic severe low back pain for the past 6 months, with a past medical history of prostate cancer, never got treated. Will be admitted with the following assessment and plan.   1. Severe low back pain from multilevel spinal canal narrowing, probably from the metastatic prostate cancer. Decadron 10 mg IV was given in the ER. Will continue Decadron 4 mg IV q.6 hours.  STAT consult is placed to radiation oncologist, Dr. Lorre NickGittin, for palliative treatment. Dr. Lorre NickGittin is aware, and he will see the patient as soon as possible.  Morphine 4 mg IV 2 to 4 mg IV as needed for pain.  Bedrest.  Will keep him n.p.o. and provide IV fluids.  2. Acute cystitis with hydronephrosis. Urine culture and sensitivities ordered. Ciprofloxacin will be given in the IV form.  Foley catheter to help with possible obstructive uropathy. 3. Metastatic prostate cancer. The patient's son and  daughter-in-law are seeking only palliative care with comfort care measures.  4. Hypertension. Blood pressure is stable.   CODE STATUS: He is do not resuscitate. Son is the medical power of attorney. Considering comfort care.  Diagnosis and plan of care were discussed in detail with the patient and his son and daughter-in-law at bedside. They all verbalized understanding of the plan.   TOTAL TIME SPENT ON ADMISSION: 50 minutes.   ____________________________ Ramonita LabAruna Shian Goodnow, MD ag:OSi D: 03/21/2013 07:51:00 ET T: 03/21/2013 08:12:14 ET JOB#: [id number]  cc: Ramonita LabAruna Ritchard Paragas, MD, <Dictator> Ramonita LabARUNA Boyde Grieco MD ELECTRONICALLY SIGNED 03/22/2013 19:38

## 2014-11-22 NOTE — Consult Note (Signed)
Brief Consult Note: Diagnosis: dementia, multifactorial.   Patient was seen by consultant.   Comments: Psychiatry: Brief consult on this 81 year old gentleman. I apologize that this was not done on Friday. I am not sure how I made a mistake but I did not see him at that time. Consult is for capacity. On brief interview today the patient tells me that Jesus has healed him of his cancer. At the same time he tells me that he realizes that he can't take care of himself. He tells me that he is going home to live with his son. Apparently that is more or less correct. Either he will be living with the son or someone will be staying with him. I did not do full cognitive testing but the patient is awake and alert but intermittently confused and clearly physically disabled to the point of not being able to care for himself due to his illness.  If it were to come to an issue of the patient refusing appropriate and safe placement I think that we would have to say that he lacks the capacity to make that decision. On the other hand if there is a plan in place for what the family and the patient considered to be a safe discharge plan and then the point is moot. It is my understanding from the patient and the nursing staff that there is currently a plan in place that the family feels comfortable with and that the staff feels comfortable with as far as care for this gentleman at home. Care this point appears to be largely comfort based. If that is not the case and there is continuing to be an issue of the patient refusing a safe living arrangement then please call me back.  Diagnosis Axis I: Dementia multifactorial.  Electronic Signatures: Audery Amellapacs, John T (MD)  (Signed 25-Aug-14 18:14)  Authored: Brief Consult Note   Last Updated: 25-Aug-14 18:14 by Audery Amellapacs, John T (MD)

## 2014-11-22 NOTE — Discharge Summary (Signed)
PATIENT NAME:  [name], Shooter MR#:  [id number] DATE OF BIRTH:  [birth year]  DATE OF ADMISSION:  06/05/2013 DATE OF DISCHARGE:  06/11/2013  DISCHARGE DIAGNOSES:  1.  sepsis from prostatitis 2.  Encephalopathy from above.  3.  Prostatitis.  4.  Prostate cancer, metastatic.  5.  Dementia, Alzheimer's type.   DISCHARGE MEDICATIONS: [facility]. Briefly, he will finish his Levaquin. I am trying to get him Lupron monthly at least as a trial for his prostate cancer and he will be on Casodex as well.    HISTORY AND PHYSICAL: Please see detailed history and physical done on admission.   HOSPITAL COURSE: The patient was admitted more confused than baseline with pyuria, but negative blood cultures. He did have a urine culture positive for E. coli that was resistant to sulfa, but sensitive to the levofloxacin. Chest x-ray showed a vague nodular density at 8 mm only. Given the clinical situation and age, etc., this will not be pursued at this point. CT of the head was unremarkable. He had a leukocytosis that improved over the course of hospitalization from 13,000 down to 6000. His pulse oximetry was 97 on room air this morning. He will be discharged and continued on his Seroquel and other meds as described above. Physical therapy and occupational therapy will be ordered.    TIME SPENT: It took approximately 35 minutes to do all discharge tasks today.  ____________________________ Marya AmslerMarshall W. Dareen PianoAnderson, MD mwa:aw D: 06/11/2013 07:37:22 ET T: 06/11/2013 07:44:33 ET JOB#: [id number]  cc: Marya AmslerMarshall W. Dareen PianoAnderson, MD, <Dictator> Lauro RegulusMARSHALL W Myisha Pickerel MD ELECTRONICALLY SIGNED 06/12/2013 8:10

## 2014-11-22 NOTE — Consult Note (Signed)
Chief Complaint:  Subjective/Chief Complaint NO ACUTE COMPLAINT  STATED BACK PAIN EARLIER WHEN MOVED. APPETITE GOOD ATE 2 FULL MEALS   VITAL SIGNS/ANCILLARY NOTES: **Vital Signs.:   21-Aug-14 19:34  Vital Signs Type Routine  Temperature Temperature (F) 96.5  Celsius 35.8  Temperature Source axillary  Pulse Pulse 55  Respirations Respirations 20  Systolic BP Systolic BP 867  Diastolic BP (mmHg) Diastolic BP (mmHg) 79  Mean BP 98  Pulse Ox % Pulse Ox % 85  Pulse Ox Activity Level  At rest  Oxygen Delivery Room Air/ 21 %   Brief Assessment:  Additional Physical Exam NO ACUTE DISTRESS, NO WHEEZING  NO RHONCHI, NO FOCAL WEAKNESS GIOOD STRENGTH LEGS ABDO SOFT NON TENDER   Lab Results: Thyroid:  21-Aug-14 05:08   Thyroid Stimulating Hormone 0.669 (0.45-4.50 (International Unit)  ----------------------- Pregnant patients have  different reference  ranges for TSH:  - - - - - - - - - -  Pregnant, first trimetser:  0.36 - 2.50 uIU/mL)  Hepatic:  21-Aug-14 05:08   Bilirubin, Total 0.6  Alkaline Phosphatase  165  SGPT (ALT)  9  SGOT (AST) 28  Total Protein, Serum  6.0  Albumin, Serum  2.3  Routine Chem:  21-Aug-14 05:08   Glucose, Serum  152  BUN  20  Creatinine (comp) 0.93  Sodium, Serum 137  Potassium, Serum 4.2  Chloride, Serum 101  CO2, Serum 31  Calcium (Total), Serum  8.2  Osmolality (calc) 279  eGFR (African American) >60  eGFR (Non-African American) >60 (eGFR values <52m/min/1.73 m2 may be an indication of chronic kidney disease (CKD). Calculated eGFR is useful in patients with stable renal function. The eGFR calculation will not be reliable in acutely ill patients when serum creatinine is changing rapidly. It is not useful in  patients on dialysis. The eGFR calculation may not be applicable to patients at the low and high extremes of body sizes, pregnant women, and vegetarians.)  Anion Gap  5  Magnesium, Serum  1.4 (1.8-2.4 THERAPEUTIC RANGE: 4-7  mg/dL TOXIC: > 10 mg/dL  -----------------------)  Routine Hem:  21-Aug-14 05:08   WBC (CBC)  3.2  RBC (CBC)  4.16  Hemoglobin (CBC)  11.3  Hematocrit (CBC)  34.2  Platelet Count (CBC) 162  MCV 82  MCH 27.2  MCHC 33.0  RDW  19.4  Neutrophil % 78.9  Lymphocyte % 11.1  Monocyte % 8.9  Eosinophil % 0.0  Basophil % 1.1  Neutrophil # 2.5  Lymphocyte #  0.3  Monocyte # 0.3  Eosinophil # 0.0  Basophil # 0.0 (Result(s) reported on 22 Mar 2013 at 05:25AM.)   Assessment/Plan:  Assessment/Plan:  Assessment CA PROSTATE METS WIDELY TO BONES, AND LUNG, AND LOCALLY EXTENSIVE PELVIS, FOLEY URINE CLEAR, CRE GOOD, PSA OVER 8000,    PLAN STARTED PO CADODEX TODAY, PLAN DAILY, AND AFTER 2 WEEKS BUT NOT BEFORE, ADD LUPRON. MAY TAPER DECADRON. DISCUSSED WITH DR CHRYSTAL TODAY FOR RADIATION TX PALLIATIVE TO BACK , PAINFULL AREA WHERE EPIDURAL DISEASE NOTED.   Electronic Signatures: GDallas Schimke(MD)  (Signed 21-Aug-14 20:56)  Authored: Chief Complaint, VITAL SIGNS/ANCILLARY NOTES, Brief Assessment, Lab Results, Assessment/Plan   Last Updated: 21-Aug-14 20:56 by GDallas Schimke(MD)

## 2014-11-22 NOTE — Consult Note (Signed)
   Comments   I spoke with pt's son and daughter. They plan for pt to return home with them at discharge with home health following. They are aware that they can contact a hospice agency if pt declines for help with his care. Family fears that pt will not return for any doctor appts or XRT once he leaves hospital but realize that is pt's decision. Family requests psychiatry consult for competency. Also requested SW consult to help with discharge plan. Discussed with Dr Mordecai MaesSanchez.   Electronic Signatures: Heinrich Fertig, Harriett SineNancy (MD)  (Signed 21-Aug-14 15:27)  Authored: Palliative Care   Last Updated: 21-Aug-14 15:27 by Tahjae Durr, Harriett SineNancy (MD)

## 2015-03-17 IMAGING — CT CT HEAD WITHOUT CONTRAST
1 series · 16 of 30 positions shown, 20 images · non-contrast
Comparison: Prior CT from 03/21/2013

CLINICAL DATA: Fever, history of metastatic prostate cancer

EXAM:
CT HEAD WITHOUT CONTRAST
TECHNIQUE: Contiguous axial images were obtained from the base of the skull
through the vertex without intravenous contrast.

[Series 2: head wo · axial · 0.40mm/px · z∈[-160,-34]mm · 16 of 32 slices shown, 20 images]
[im 2/32  brain]
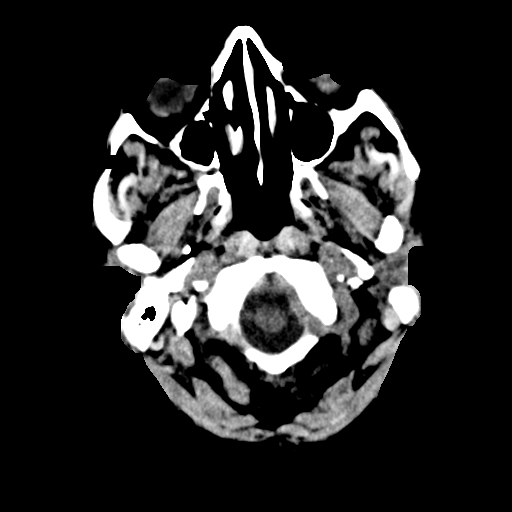
[im 2/32  bone]
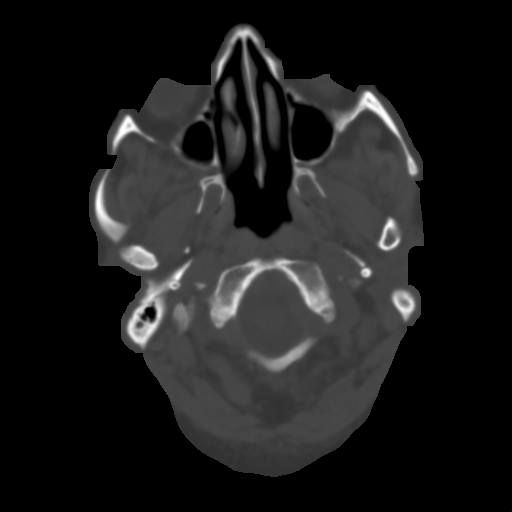
[im 4/32  brain]
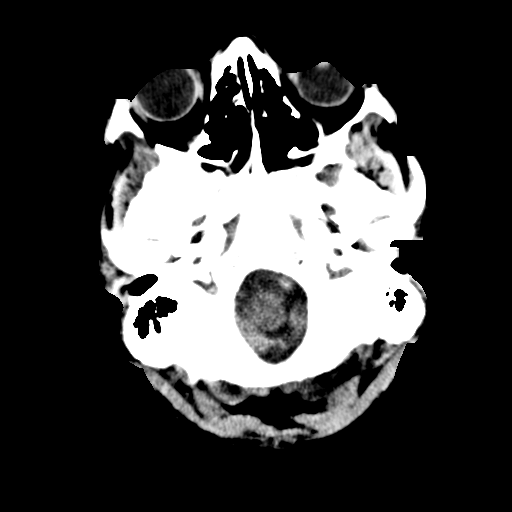
[im 6/32  brain]
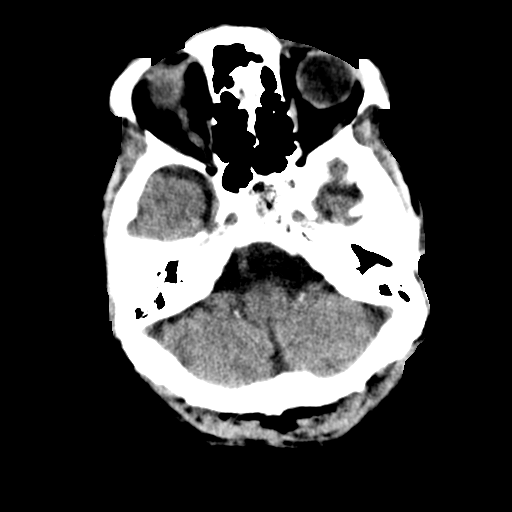
[im 8/32  brain]
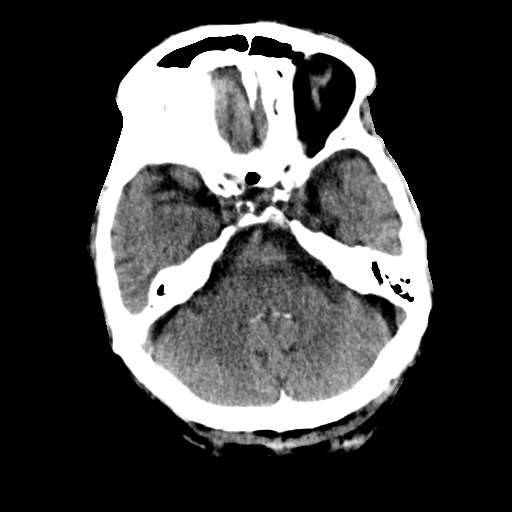
[im 9/32  brain]
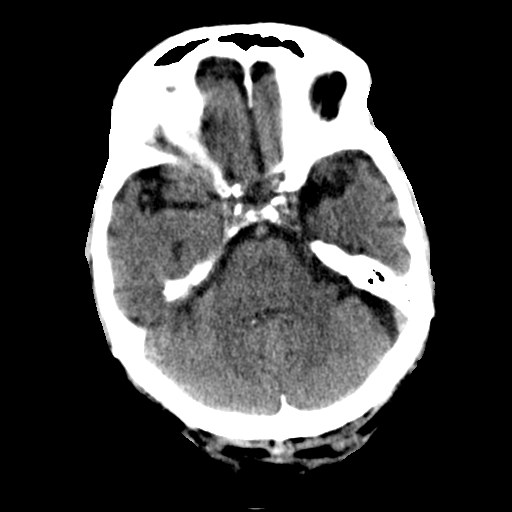
[im 9/32  bone]
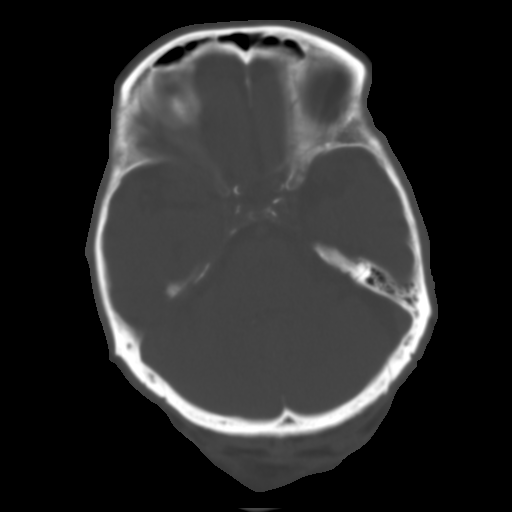
[im 11/32  brain]
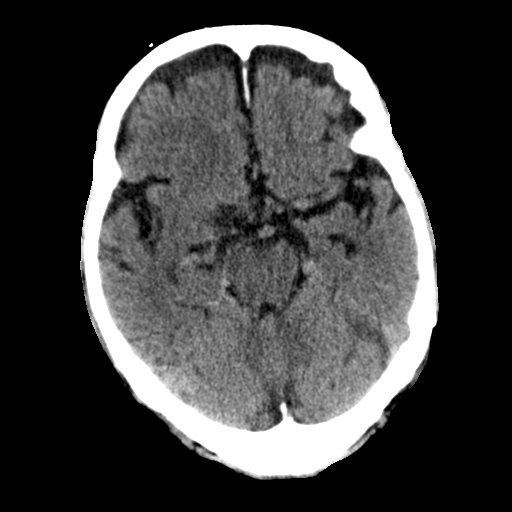
[im 13/32  brain]
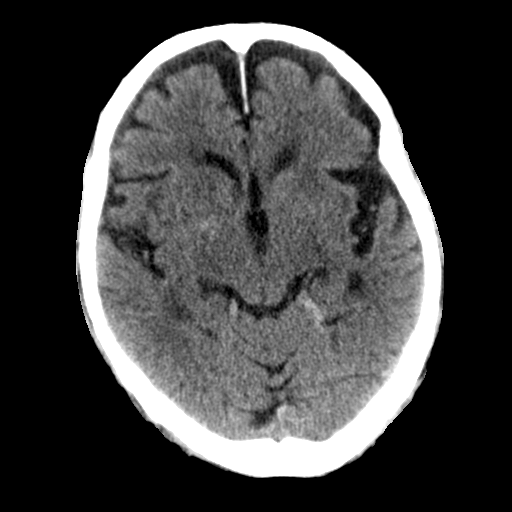
[im 15/32  brain]
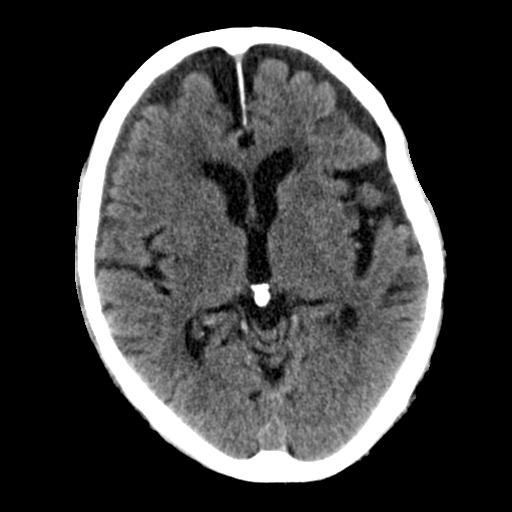
[im 17/32  brain]
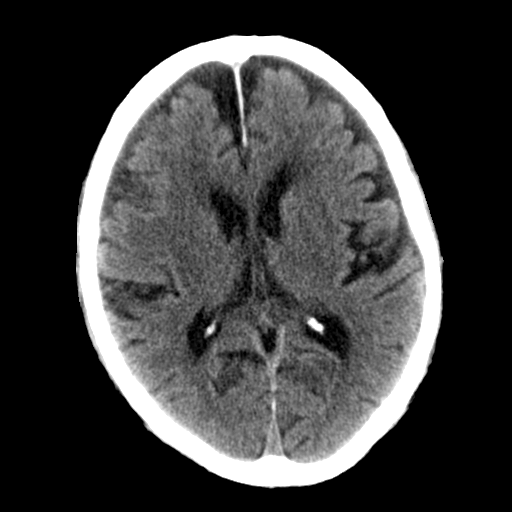
[im 17/32  bone]
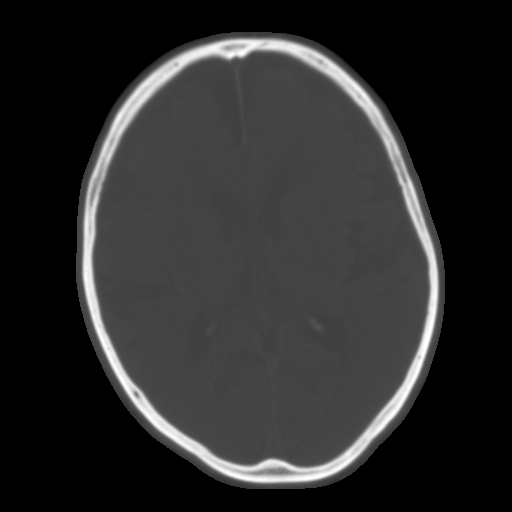
[im 19/32  brain]
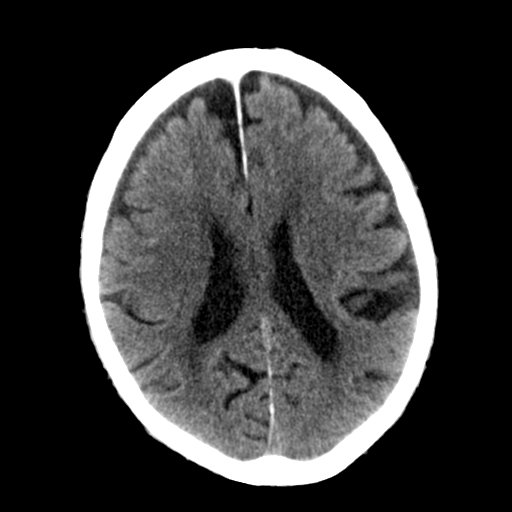
[im 21/32  brain]
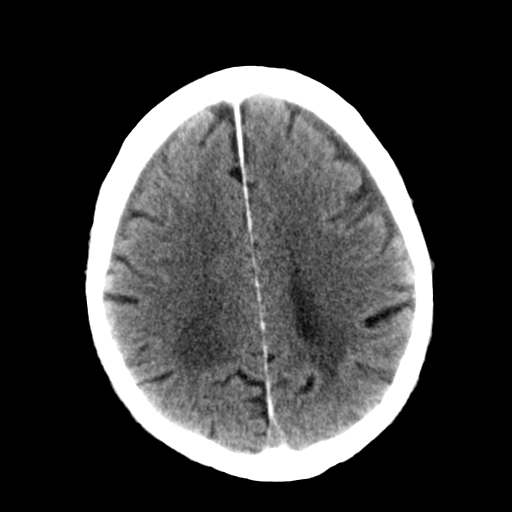
[im 23/32  brain]
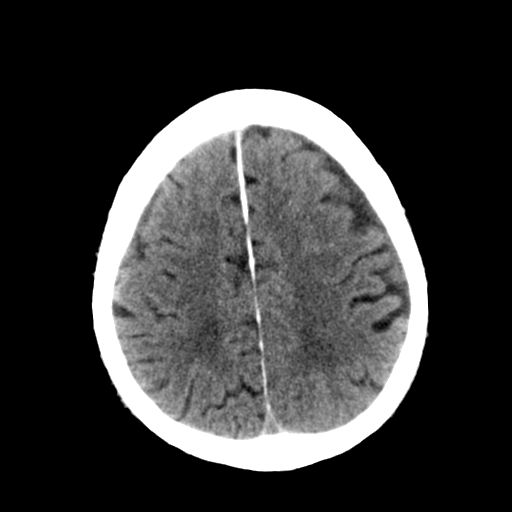
[im 24/32  brain]
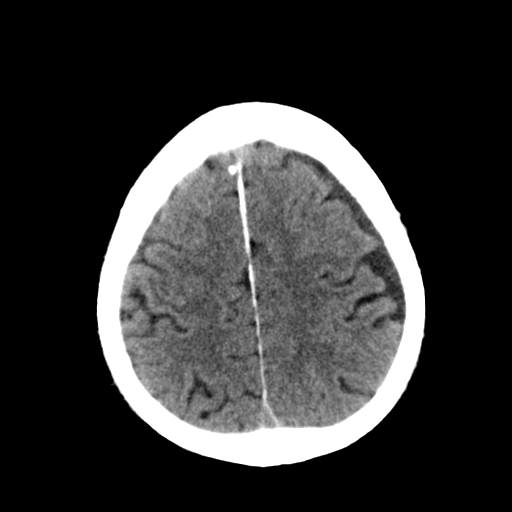
[im 24/32  bone]
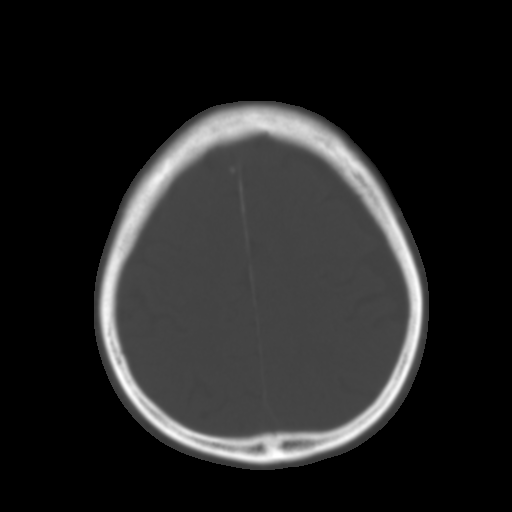
[im 26/32  brain]
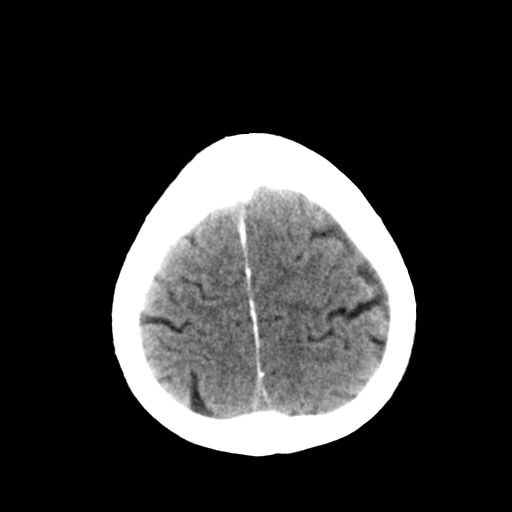
[im 28/32  brain]
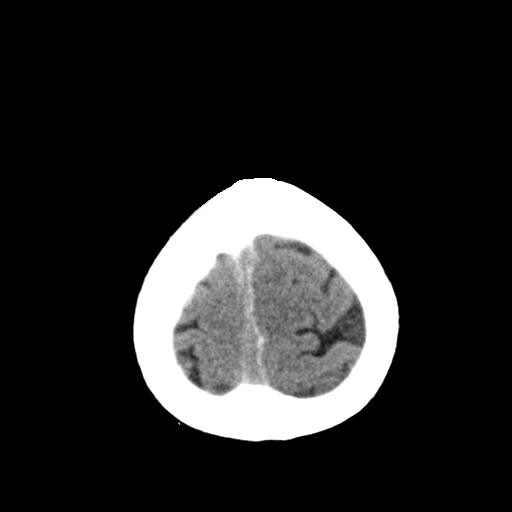
[im 30/32  brain]
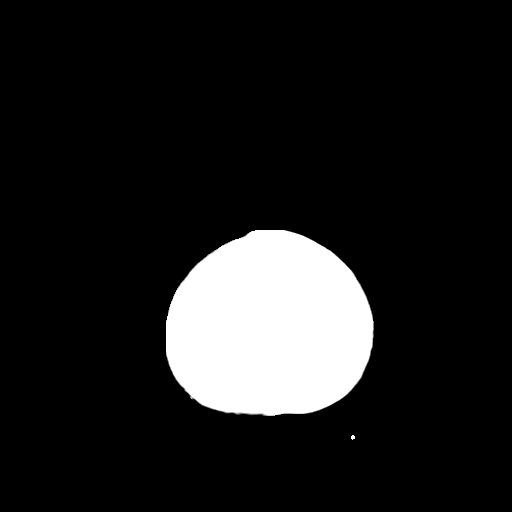

[16 of 30 positions shown; findings below may reference images not displayed]

FINDINGS: Prominence of the CSF containing spaces is compatible with
generalized atrophy. The confluent and scattered hypodensity within
the periventricular and deep white matter is again noted, consistent
with chronic microvascular ischemic disease. No acute intracranial
hemorrhage or infarct is identified. Prominence of the extra-axial
spaces overlying both cerebral convexities is similar as compared to
prior exam, related atrophy.

No focal osseous lesions related to prostate cancer appreciated.
Orbits are within normal limits. The paranasal sinuses and mastoid
air cells are clear.
IMPRESSION: 1. No acute intracranial process.
2. Grossly stable appearance of chronic atrophy and microvascular
ischemic changes.

## 2015-06-03 DEATH — deceased
